# Patient Record
Sex: Female | Born: 2005 | Race: Black or African American | Hispanic: No | Marital: Single | Smoking: Never smoker
Health system: Southern US, Community
[De-identification: ages and names within clinical notes are randomized; demographics above are authoritative.]

## PROBLEM LIST (undated history)

## (undated) DIAGNOSIS — T7840XA Allergy, unspecified, initial encounter: Secondary | ICD-10-CM

## (undated) DIAGNOSIS — F32A Depression, unspecified: Secondary | ICD-10-CM

## (undated) HISTORY — DX: Allergy, unspecified, initial encounter: T78.40XA

## (undated) HISTORY — DX: Depression, unspecified: F32.A

---
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
## 2023-02-11 ENCOUNTER — Telehealth: Admitting: Physician Assistant

## 2023-02-11 DIAGNOSIS — N898 Other specified noninflammatory disorders of vagina: Secondary | ICD-10-CM

## 2023-02-11 DIAGNOSIS — R3989 Other symptoms and signs involving the genitourinary system: Secondary | ICD-10-CM | POA: Diagnosis not present

## 2023-02-11 DIAGNOSIS — R197 Diarrhea, unspecified: Secondary | ICD-10-CM

## 2023-02-11 DIAGNOSIS — M545 Low back pain, unspecified: Secondary | ICD-10-CM

## 2023-02-11 DIAGNOSIS — R300 Dysuria: Secondary | ICD-10-CM

## 2023-02-11 MED ORDER — NITROFURANTOIN MONOHYD MACRO 100 MG PO CAPS: 100.0000 mg | ORAL_CAPSULE | Freq: Two times a day (BID) | ORAL | 0 refills | Status: DC

## 2023-02-11 NOTE — Patient Instructions (Signed)
Taylor Schaefer, thank you for joining Margaretann Loveless, PA-C for today's virtual visit.  While this provider is not your primary care provider (PCP), if your PCP is located in our provider database this encounter information will be shared with them immediately following your visit.   A [facility] MyChart account gives you access to today's visit and all your visits, tests, and labs performed at [facility] " click here if you don't have a [facility] MyChart account or go to [email or web address]  Consent: (Patient) Taylor Schaefer provided verbal consent for this virtual visit at the beginning of the encounter.  Current Medications:  Current Outpatient Medications:    nitrofurantoin, macrocrystal-monohydrate, (MACROBID) 100 MG capsule, Take 1 capsule (100 mg total) by mouth 2 (two) times daily., Disp: 10 capsule, Rfl: 0   Medications ordered in this encounter:  Meds ordered this encounter  Medications   nitrofurantoin, macrocrystal-monohydrate, (MACROBID) 100 MG capsule    Sig: Take 1 capsule (100 mg total) by mouth 2 (two) times daily.    Dispense:  10 capsule    Refill:  0    Order Specific Question:   Supervising Provider    Answer:   Taylor Schaefer [id number]     *If you need refills on other medications prior to your next appointment, please contact your pharmacy*  Follow-Up: Call back or seek an in-person evaluation if the symptoms worsen or if the condition fails to improve as anticipated.  [facility] [phone]  Other Instructions  Urinary Tract Infection, Adult  A urinary tract infection (UTI) is an infection of any part of the urinary tract. The urinary tract includes the kidneys, ureters, bladder, and urethra. These organs make, store, and get rid of urine in the body. An upper UTI affects the ureters and kidneys. A lower UTI affects the bladder and urethra. What are the causes? Most urinary tract infections are caused by bacteria  in your genital area around your urethra, where urine leaves your body. These bacteria grow and cause inflammation of your urinary tract. What increases the risk? You are more likely to develop this condition if: You have a urinary catheter that stays in place. You are not able to control when you urinate or have a bowel movement (incontinence). You are female and you: Use a spermicide or diaphragm for birth control. Have low estrogen levels. Are pregnant. You have certain genes that increase your risk. You are sexually active. You take antibiotic medicines. You have a condition that causes your flow of urine to slow down, such as: An enlarged prostate, if you are female. Blockage in your urethra. A kidney stone. A nerve condition that affects your bladder control (neurogenic bladder). Not getting enough to drink, or not urinating often. You have certain medical conditions, such as: Diabetes. A weak disease-fighting system (immunesystem). Sickle cell disease. Gout. Spinal cord injury. What are the signs or symptoms? Symptoms of this condition include: Needing to urinate right away (urgency). Frequent urination. This may include small amounts of urine each time you urinate. Pain or burning with urination. Blood in the urine. Urine that smells bad or unusual. Trouble urinating. Cloudy urine. Vaginal discharge, if you are female. Pain in the abdomen or the lower back. You may also have: Vomiting or a decreased appetite. Confusion. Irritability or tiredness. A fever or chills. Diarrhea. The first symptom in older adults may be confusion. In some cases, they may not have any symptoms until the infection has worsened. How is  this diagnosed? This condition is diagnosed based on your medical history and a physical exam. You may also have other tests, including: Urine tests. Blood tests. Tests for STIs (sexually transmitted infections). If you have had more than one UTI, a  cystoscopy or imaging studies may be done to determine the cause of the infections. How is this treated? Treatment for this condition includes: Antibiotic medicine. Over-the-counter medicines to treat discomfort. Drinking enough water to stay hydrated. If you have frequent infections or have other conditions such as a kidney stone, you may need to see a health care provider who specializes in the urinary tract (urologist). In rare cases, urinary tract infections can cause sepsis. Sepsis is a life-threatening condition that occurs when the body responds to an infection. Sepsis is treated in the hospital with IV antibiotics, fluids, and other medicines. Follow these instructions at home:  Medicines Take over-the-counter and prescription medicines only as told by your health care provider. If you were prescribed an antibiotic medicine, take it as told by your health care provider. Do not stop using the antibiotic even if you start to feel better. General instructions Make sure you: Empty your bladder often and completely. Do not hold urine for long periods of time. Empty your bladder after sex. Wipe from front to back after urinating or having a bowel movement if you are female. Use each tissue only one time when you wipe. Drink enough fluid to keep your urine pale yellow. Keep all follow-up visits. This is important. Contact a health care provider if: Your symptoms do not get better after 1-2 days. Your symptoms go away and then return. Get help right away if: You have severe pain in your back or your lower abdomen. You have a fever or chills. You have nausea or vomiting. Summary A urinary tract infection (UTI) is an infection of any part of the urinary tract, which includes the kidneys, ureters, bladder, and urethra. Most urinary tract infections are caused by bacteria in your genital area. Treatment for this condition often includes antibiotic medicines. If you were prescribed an  antibiotic medicine, take it as told by your health care provider. Do not stop using the antibiotic even if you start to feel better. Keep all follow-up visits. This is important. This information is not intended to replace advice given to you by your health care provider. Make sure you discuss any questions you have with your health care provider. Document Revised: 03/10/2020 Document Reviewed: 03/15/2020 Elsevier Patient Education  2024 Elsevier Inc.    If you have been instructed to have an in-person evaluation today at a local Urgent Care facility, please use the link below. It will take you to a list of all of our available [facility], including address, phone number and hours of operation. Please do not delay care.  [facility]  If you or a family member do not have a primary care provider, use the link below to schedule a visit and establish care. When you choose a [facility] primary care physician or advanced practice provider, you gain a long-term partner in health. Find a Primary Care Provider  Learn more about [facility] in-office and virtual care options: [facility] - Get Care Now

## 2023-02-11 NOTE — Progress Notes (Signed)
Virtual Visit Consent   Taylor Schaefer, you are scheduled for a virtual visit with a [facility] provider today. Just as with appointments in the office, your consent must be obtained to participate. Your consent will be active for this visit and any virtual visit you may have with one of our providers in the next 365 days. If you have a MyChart account, a copy of this consent can be sent to you electronically.  As this is a virtual visit, video technology does not allow for your provider to perform a traditional examination. This may limit your provider's ability to fully assess your condition. If your provider identifies any concerns that need to be evaluated in person or the need to arrange testing (such as labs, EKG, etc.), we will make arrangements to do so. Although advances in technology are sophisticated, we cannot ensure that it will always work on either your end or our end. If the connection with a video visit is poor, the visit may have to be switched to a telephone visit. With either a video or telephone visit, we are not always able to ensure that we have a secure connection.  By engaging in this virtual visit, you consent to the provision of healthcare and authorize for your insurance to be billed (if applicable) for the services provided during this visit. Depending on your insurance coverage, you may receive a charge related to this service.  I need to obtain your verbal consent now. Are you willing to proceed with your visit today? Taylor Schaefer has provided verbal consent on 02/11/2023 for a virtual visit (video or telephone). Margaretann Loveless, PA-C  Date: 02/11/2023 11:52 AM  Virtual Visit via Video Note   I, Margaretann Loveless, connected with  Taylor Schaefer  ([id number], 2006/02/19) on 02/11/23 at 11:45 AM EDT by a video-enabled telemedicine application and verified that I am speaking with the correct person using two identifiers.  Location: Patient: Virtual Visit Location Patient:  Home Provider: Virtual Visit Location Provider: Home Office   I discussed the limitations of evaluation and management by telemedicine and the availability of in person appointments. The patient expressed understanding and agreed to proceed.    History of Present Illness: Taylor Schaefer is a 17 y.o. who identifies as a female who was assigned female at birth, and is being seen today for suspected UTI.  HPI: Urinary Tract Infection  This is a new problem. The current episode started 1 to 4 weeks ago (about 5-7 days ago). The problem occurs every urination. The problem has been gradually worsening. The quality of the pain is described as burning. The pain is mild. There has been no fever. Associated symptoms include a discharge (clear), frequency, hesitancy and urgency. Pertinent negatives include no chills, flank pain, hematuria, nausea or vomiting. Associated symptoms comments: Cloudy urine, strong ammonia smell to urine, body aches but feels this is from standing in one spot and working long hours at work. She has tried increased fluids for the symptoms. The treatment provided no relief.     Problems: There are no problems to display for this patient.   Allergies:  Allergies  Allergen Reactions   Peanut Oil Rash   Medications:  Current Outpatient Medications:    nitrofurantoin, macrocrystal-monohydrate, (MACROBID) 100 MG capsule, Take 1 capsule (100 mg total) by mouth 2 (two) times daily., Disp: 10 capsule, Rfl: 0  Observations/Objective: Patient is well-developed, well-nourished in no acute distress.  Resting comfortably at home.  Head is normocephalic, atraumatic.  No labored breathing.  Speech is clear and coherent with logical content.  Patient is alert and oriented at baseline.    Assessment and Plan: 1. Suspected UTI - nitrofurantoin, macrocrystal-monohydrate, (MACROBID) 100 MG capsule; Take 1 capsule (100 mg total) by mouth 2 (two) times daily.  Dispense: 10 capsule; Refill:  0  - Worsening symptoms.  - Will treat empirically with Macrobid - May use AZO for bladder spasms - Continue to push fluids.  - Seek in person evaluation for urine culture if symptoms do not improve or if they worsen.    Follow Up Instructions: I discussed the assessment and treatment plan with the patient. The patient was provided an opportunity to ask questions and all were answered. The patient agreed with the plan and demonstrated an understanding of the instructions.  A copy of instructions were sent to the patient via MyChart unless otherwise noted below.    The patient was advised to call back or seek an in-person evaluation if the symptoms worsen or if the condition fails to improve as anticipated.  Time:  I spent 8 minutes with the patient via telehealth technology discussing the above problems/concerns.    Margaretann Loveless, PA-C

## 2023-02-11 NOTE — Progress Notes (Signed)
Because you are having urinary symptoms with vaginal discharge, back pain, and diarrhea, I feel your condition warrants further evaluation and I recommend that you be seen in a face to face visit.   NOTE: There will be NO CHARGE for this eVisit   If you are having a true medical emergency please call 911.      For an urgent face to face visit, [facility] has eight urgent care centers for your convenience:   NEW!! [facility] at [facility] Get Driving Directions [phone] [address], [address] [place], [address]    [facility] Center at [facility] Get Driving Directions [phone] [address] [place], [place] [address]   [facility]gent Care Center [facility]) Get Driving Directions [phone] [address] [place], [place] [address]  [facility] Health Urgent Care Center [facility]) Get Driving Directions [phone] [address] [place],  [place]  [address]  [facility] Urgent Care Center [facility] - at [employer]  [phone] [address] [place],  [place] [address]   [facility]gent Care at [facility] Get Driving Directions [phone] [address] [place], [place] [address]   [facility] Health Urgent Care at [facility] Get Driving Directions  [phone] [address] [place], [place] [address]   [facility] Health Urgent Care at [facility] Directions [phone] [address] [place], [place] [address]  Your MyChart E-visit questionnaire answers were reviewed by a board certified advanced clinical practitioner to complete your personal care plan based on your specific symptoms.  Thank you for using e-Visits.    I have spent 5 minutes in review of e-visit questionnaire, review and updating patient chart, medical decision making and response to patient.    Margaretann Loveless, PA-C

## 2023-04-03 NOTE — Progress Notes (Deleted)
[facility] [address] [place], [place] [address] [phone]  Date:  04/08/2023   Name:  Taziya Robin   DOB:  2005-10-13   MRN:  [id number]  PCP:  Pearline Cables, MD    Chief Complaint: No chief complaint on file.   History of Present Illness:  Isra Mathew is a 17 y.o. very pleasant female patient who presents with the following:  Patient seen today for physical exam Last seen by myself about 1 year ago, also for physical At her last visit she had graduated from high school and was starting college at [employer].  She planned to study criminal justice Enjoys sports including basketball and volleyball  She had a Nexplanon placed last year  It looks like she was seen and treated for chlamydia this past February  Can offer STI screening Recommend flu, COVID booster this fall There are no problems to display for this patient.   Past Medical History:  Diagnosis Date   Allergies    Depression     No past surgical history on file.  Social History   Tobacco Use   Smoking status: Never    Passive exposure: Never   Smokeless tobacco: Never  Vaping Use   Vaping status: Never Used  Substance Use Topics   Alcohol use: Never   Drug use: Not Currently    Types: Marijuana    Family History  Problem Relation Age of Onset   Asthma Mother    Hypertension Maternal Grandmother    Hyperlipidemia Maternal Grandmother    Osteoarthritis Maternal Grandmother    Alcohol abuse Maternal Grandmother    Heart attack Maternal Grandmother    Heart disease Maternal Grandmother    Alcohol abuse Paternal Grandfather     Allergies  Allergen Reactions   Peanut Oil Rash    Medication list has been reviewed and updated.  Current Outpatient Medications on File Prior to Visit  Medication Sig Dispense Refill   nitrofurantoin, macrocrystal-monohydrate, (MACROBID) 100 MG capsule Take 1 capsule (100 mg total) by mouth 2  (two) times daily. 10 capsule 0   No current facility-administered medications on file prior to visit.    Review of Systems:  As per HPI- otherwise negative.   Physical Examination: There were no vitals filed for this visit. There were no vitals filed for this visit. There is no height or weight on file to calculate BMI. Ideal Body Weight:    GEN: no acute distress. HEENT: Atraumatic, Normocephalic.  Ears and Nose: No external deformity. CV: RRR, No M/G/R. No JVD. No thrill. No extra heart sounds. PULM: CTA B, no wheezes, crackles, rhonchi. No retractions. No resp. distress. No accessory muscle use. ABD: S, NT, ND, +BS. No rebound. No HSM. EXTR: No c/c/e PSYCH: Normally interactive. Conversant.    Assessment and Plan: ***  Signed Abbe Amsterdam, MD

## 2023-04-08 ENCOUNTER — Encounter: Admitting: Family Medicine

## 2023-04-20 NOTE — Progress Notes (Deleted)
[facility] [address] [place], [place] [address] [phone]  Date:  04/22/2023   Name:  Taylor Schaefer   DOB:  [birth year]   MRN:  [id number]  PCP:  Pearline Cables, MD    Chief Complaint: No chief complaint on file.   History of Present Illness:  Taylor Schaefer is a 17 y.o. very pleasant female patient who presents with the following: 'May- Lani" Patient seen today for physical exam.  Generally healthy young woman Most recent visit with myself was about 1 year ago; at that time she had recently graduated from high school and was planning to start college at [employer], majoring in criminal justice  She enjoys volleyball and other sports  Since her last visit she was treated for chlamydia-we can do a test of cure for her  Flu shot COVID booster Can update routine blood work   Her HPV series is complete  There are no problems to display for this patient.   Past Medical History:  Diagnosis Date   Allergies    Depression     No past surgical history on file.  Social History   Tobacco Use   Smoking status: Never    Passive exposure: Never   Smokeless tobacco: Never  Vaping Use   Vaping status: Never Used  Substance Use Topics   Alcohol use: Never   Drug use: Not Currently    Types: Marijuana    Family History  Problem Relation Age of Onset   Asthma Mother    Hypertension Maternal Grandmother    Hyperlipidemia Maternal Grandmother    Osteoarthritis Maternal Grandmother    Alcohol abuse Maternal Grandmother    Heart attack Maternal Grandmother    Heart disease Maternal Grandmother    Alcohol abuse Paternal Grandfather     Allergies  Allergen Reactions   Peanut Oil Rash    Medication list has been reviewed and updated.  Current Outpatient Medications on File Prior to Visit  Medication Sig Dispense Refill   nitrofurantoin, macrocrystal-monohydrate, (MACROBID) 100 MG capsule  Take 1 capsule (100 mg total) by mouth 2 (two) times daily. 10 capsule 0   No current facility-administered medications on file prior to visit.    Review of Systems:  As per HPI- otherwise negative.   Physical Examination: There were no vitals filed for this visit. There were no vitals filed for this visit. There is no height or weight on file to calculate BMI. Ideal Body Weight:    GEN: no acute distress. HEENT: Atraumatic, Normocephalic.  Ears and Nose: No external deformity. CV: RRR, No M/G/R. No JVD. No thrill. No extra heart sounds. PULM: CTA B, no wheezes, crackles, rhonchi. No retractions. No resp. distress. No accessory muscle use. ABD: S, NT, ND, +BS. No rebound. No HSM. EXTR: No c/c/e PSYCH: Normally interactive. Conversant.    Assessment and Plan: *** Physical exam today.  Encouraged healthy diet and exercise routine Signed Abbe Amsterdam, MD

## 2023-04-22 ENCOUNTER — Encounter: Admitting: Family Medicine

## 2023-10-29 ENCOUNTER — Encounter: Admitting: Family Medicine

## 2024-01-10 NOTE — Progress Notes (Unsigned)
 [facility] at [facility] [address] [place], [place] [address] [phone]  Date:  01/13/2024   Name:  Taylor Schaefer   DOB:  Jul 23, 2006   MRN:  [id number]  PCP:  Kaylee Partridge, MD    Chief Complaint: No chief complaint on file.   History of Present Illness:  Taylor Schaefer is a 18 y.o. very pleasant female patient who presents with the following:  Pt seen today for a physical exam Last seen by myself about 2 years ago for CPE- at that time she was planning to start college at [employer]  There are no active problems to display for this patient.   Past Medical History:  Diagnosis Date   Allergies    Depression     No past surgical history on file.  Social History   Tobacco Use   Smoking status: Never    Passive exposure: Never   Smokeless tobacco: Never  Vaping Use   Vaping status: Never Used  Substance Use Topics   Alcohol use: Never   Drug use: Not Currently    Types: Marijuana    Family History  Problem Relation Age of Onset   Asthma Mother    Hypertension Maternal Grandmother    Hyperlipidemia Maternal Grandmother    Osteoarthritis Maternal Grandmother    Alcohol abuse Maternal Grandmother    Heart attack Maternal Grandmother    Heart disease Maternal Grandmother    Alcohol abuse Paternal Grandfather     Allergies  Allergen Reactions   Peanut Oil Rash    Medication list has been reviewed and updated.  Current Outpatient Medications on File Prior to Visit  Medication Sig Dispense Refill   nitrofurantoin , macrocrystal-monohydrate, (MACROBID ) 100 MG capsule Take 1 capsule (100 mg total) by mouth 2 (two) times daily. 10 capsule 0   No current facility-administered medications on file prior to visit.    Review of Systems:  As per HPI- otherwise negative.   Physical Examination: There were no vitals filed for this visit. There were no vitals filed for this visit. There is no height or  weight on file to calculate BMI. Ideal Body Weight:    GEN: no acute distress. HEENT: Atraumatic, Normocephalic.  Ears and Nose: No external deformity. CV: RRR, No M/G/R. No JVD. No thrill. No extra heart sounds. PULM: CTA B, no wheezes, crackles, rhonchi. No retractions. No resp. distress. No accessory muscle use. ABD: S, NT, ND, +BS. No rebound. No HSM. EXTR: No c/c/e PSYCH: Normally interactive. Conversant.    Assessment and Plan: *** Physical exam today- encouraged healthy diet and exercise routine  Signed Gates Kasal, MD

## 2024-01-13 ENCOUNTER — Other Ambulatory Visit (HOSPITAL_COMMUNITY)
Admission: RE | Admit: 2024-01-13 | Discharge: 2024-01-13 | Disposition: A | Discharge status: Home / Self Care | Source: Ambulatory Visit | Attending: Family Medicine | Admitting: Family Medicine

## 2024-01-13 ENCOUNTER — Ambulatory Visit (INDEPENDENT_AMBULATORY_CARE_PROVIDER_SITE_OTHER): Admitting: Family Medicine

## 2024-01-13 VITALS — BP 96/60 | HR 85 | Temp 98.7°F | Ht 71.0 in | Wt 162.0 lb

## 2024-01-13 DIAGNOSIS — Z113 Encounter for screening for infections with a predominantly sexual mode of transmission: Secondary | ICD-10-CM

## 2024-01-13 DIAGNOSIS — Z0000 Encounter for general adult medical examination without abnormal findings: Secondary | ICD-10-CM

## 2024-01-13 NOTE — Patient Instructions (Addendum)
 Great to see you again today!  I will be in touch with your results asap Recommend condom use to protect yourself from STI Keep working on a healthy diet and exercise routine.  However your weight is actually just fine for your height!

## 2024-01-14 ENCOUNTER — Encounter: Payer: Self-pay | Admitting: Family Medicine

## 2024-01-14 LAB — CERVICOVAGINAL ANCILLARY ONLY
Bacterial Vaginitis (gardnerella): NEGATIVE
Candida Glabrata: NEGATIVE
Candida Vaginitis: NEGATIVE
Chlamydia: NEGATIVE
Comment: NEGATIVE
Comment: NEGATIVE
Comment: NEGATIVE
Comment: NEGATIVE
Comment: NEGATIVE
Comment: NORMAL
Neisseria Gonorrhea: NEGATIVE
Trichomonas: NEGATIVE

## 2024-03-04 NOTE — Progress Notes (Unsigned)
 [facility] at [facility] [address], [address] [place], [place] [address] [phone]  Date:  03/08/2024   Name:  Taylor Schaefer   DOB:  2006-02-04   MRN:  [id number]  PCP:  Watt Harlene BROCKS, MD    Chief Complaint: No chief complaint on file.   History of Present Illness:  Taylor Schaefer is a 18 y.o. very pleasant female patient who presents with the following:  Patient seen today for concern of  Most recent visit with myself was for physical exam in May She is a Consulting civil engineer at [employer], studying criminal justice  There are no active problems to display for this patient.   Past Medical History:  Diagnosis Date   Allergies    Depression     No past surgical history on file.  Social History   Tobacco Use   Smoking status: Never    Passive exposure: Never   Smokeless tobacco: Never  Vaping Use   Vaping status: Never Used  Substance Use Topics   Alcohol use: Never   Drug use: Not Currently    Types: Marijuana    Family History  Problem Relation Age of Onset   Asthma Mother    Hypertension Maternal Grandmother    Hyperlipidemia Maternal Grandmother    Osteoarthritis Maternal Grandmother    Alcohol abuse Maternal Grandmother    Heart attack Maternal Grandmother    Heart disease Maternal Grandmother    Alcohol abuse Paternal Grandfather     Allergies  Allergen Reactions   Peanut Oil Rash    Medication list has been reviewed and updated.  Current Outpatient Medications on File Prior to Visit  Medication Sig Dispense Refill   etonogestrel  (NEXPLANON ) 68 MG IMPL implant Inject into the skin.     nitrofurantoin , macrocrystal-monohydrate, (MACROBID ) 100 MG capsule Take 1 capsule (100 mg total) by mouth 2 (two) times daily. 10 capsule 0   No current facility-administered medications on file prior to visit.    Review of Systems:  As per HPI- otherwise negative.   Physical Examination: There were no vitals  filed for this visit. There were no vitals filed for this visit. There is no height or weight on file to calculate BMI. Ideal Body Weight:    GEN: no acute distress. HEENT: Atraumatic, Normocephalic.  Ears and Nose: No external deformity. CV: RRR, No M/G/R. No JVD. No thrill. No extra heart sounds. PULM: CTA B, no wheezes, crackles, rhonchi. No retractions. No resp. distress. No accessory muscle use. ABD: S, NT, ND, +BS. No rebound. No HSM. EXTR: No c/c/e PSYCH: Normally interactive. Conversant.    Assessment and Plan: ***  Signed Harlene Watt, MD

## 2024-03-08 ENCOUNTER — Ambulatory Visit: Admitting: Family Medicine

## 2024-03-08 ENCOUNTER — Encounter: Payer: Self-pay | Admitting: Family Medicine

## 2024-03-08 VITALS — BP 112/62 | HR 75 | Ht 71.0 in | Wt 161.6 lb

## 2024-03-08 DIAGNOSIS — Z1329 Encounter for screening for other suspected endocrine disorder: Secondary | ICD-10-CM | POA: Diagnosis not present

## 2024-03-08 DIAGNOSIS — Z113 Encounter for screening for infections with a predominantly sexual mode of transmission: Secondary | ICD-10-CM

## 2024-03-08 DIAGNOSIS — Z131 Encounter for screening for diabetes mellitus: Secondary | ICD-10-CM

## 2024-03-08 DIAGNOSIS — R3989 Other symptoms and signs involving the genitourinary system: Secondary | ICD-10-CM | POA: Diagnosis not present

## 2024-03-08 DIAGNOSIS — Z130 Encounter for screening for diseases of the blood and blood-forming organs and certain disorders involving the immune mechanism: Secondary | ICD-10-CM | POA: Diagnosis not present

## 2024-03-08 DIAGNOSIS — Z13220 Encounter for screening for lipoid disorders: Secondary | ICD-10-CM

## 2024-03-08 LAB — POCT URINALYSIS DIP (MANUAL ENTRY)
Blood, UA: NEGATIVE
Glucose, UA: NEGATIVE mg/dL
Ketones, POC UA: NEGATIVE mg/dL
Leukocytes, UA: NEGATIVE
Nitrite, UA: NEGATIVE
Spec Grav, UA: >=1.03 — AB (ref 1.010–1.025)
Urobilinogen, UA: 0.2 U/dL
pH, UA: 5 (ref 5.0–8.0)

## 2024-03-08 NOTE — Patient Instructions (Signed)
 Good to see you!  I will be in touch with your labs asap Please let me know if any changes or other concerns in the meantime

## 2024-03-09 ENCOUNTER — Encounter: Payer: Self-pay | Admitting: Family Medicine

## 2024-03-09 LAB — CBC
HCT: 34.4 % — ABNORMAL LOW (ref 36.0–46.0)
Hemoglobin: 11.7 g/dL — ABNORMAL LOW (ref 12.0–15.0)
MCHC: 34 g/dL (ref 30.0–36.0)
MCV: 88.1 fl (ref 78.0–100.0)
Platelets: 199 K/uL (ref 150.0–400.0)
RBC: 3.9 Mil/uL (ref 3.87–5.11)
RDW: 13.5 % (ref 11.5–14.6)
WBC: 6 K/uL (ref 4.5–10.5)

## 2024-03-09 LAB — HEPATITIS C ANTIBODY: Hepatitis C Ab: NONREACTIVE

## 2024-03-09 LAB — COMPREHENSIVE METABOLIC PANEL WITH GFR
ALT: 9 U/L (ref 0–35)
AST: 13 U/L (ref 0–37)
Albumin: 4.4 g/dL (ref 3.5–5.2)
Alkaline Phosphatase: 37 U/L — ABNORMAL LOW (ref 39–117)
BUN: 15 mg/dL (ref 6–23)
CO2: 25 meq/L (ref 19–32)
Calcium: 9.1 mg/dL (ref 8.4–10.5)
Chloride: 105 meq/L (ref 96–112)
Creatinine, Ser: 0.72 mg/dL (ref 0.40–1.20)
GFR: 120.68 mL/min (ref >60.00)
Glucose, Bld: 94 mg/dL (ref 70–99)
Potassium: 3.9 meq/L (ref 3.5–5.1)
Sodium: 138 meq/L (ref 135–145)
Total Bilirubin: 0.3 mg/dL (ref 0.2–1.2)
Total Protein: 7 g/dL (ref 6.0–8.3)

## 2024-03-09 LAB — LIPID PANEL
Cholesterol: 145 mg/dL (ref 0–200)
HDL: 52.3 mg/dL (ref >39.00)
LDL Cholesterol: 81 mg/dL (ref 0–99)
NonHDL: 92.26
Total CHOL/HDL Ratio: 3
Triglycerides: 57 mg/dL (ref 0.0–149.0)
VLDL: 11.4 mg/dL (ref 0.0–40.0)

## 2024-03-09 LAB — URINE CULTURE
MICRO NUMBER:: 16735757
Result:: NO GROWTH
SPECIMEN QUALITY:: ADEQUATE

## 2024-03-09 LAB — TSH: TSH: 0.88 u[IU]/mL (ref 0.35–5.50)

## 2024-03-09 LAB — HIV ANTIBODY (ROUTINE TESTING W REFLEX): HIV 1&2 Ab, 4th Generation: NONREACTIVE

## 2024-03-09 LAB — HEMOGLOBIN A1C: Hgb A1c MFr Bld: 5.6 % (ref 4.6–6.5)

## 2024-03-09 LAB — HEPATITIS B SURFACE ANTIBODY, QUANTITATIVE: Hep B S AB Quant (Post): <5 m[IU]/mL — ABNORMAL LOW (ref >=10)

## 2024-03-09 LAB — RPR: RPR Ser Ql: NONREACTIVE

## 2024-03-09 LAB — HEPATITIS B SURFACE ANTIGEN: Hepatitis B Surface Ag: NONREACTIVE

## 2024-03-10 ENCOUNTER — Encounter: Payer: Self-pay | Admitting: Family Medicine

## 2024-03-19 NOTE — Progress Notes (Unsigned)
 Taylor Schaefer at [facility] [address], [address] [place], [place] [address] [phone]  Date:  03/22/2024   Name:  Taylor Schaefer   DOB:  May 30, 2006   MRN:  [id number]  PCP:  Watt Harlene BROCKS, MD    Chief Complaint: No chief complaint on file.   History of Present Illness:  Taylor Schaefer is a 18 y.o. very pleasant female patient who presents with the following:  Patient seen today with concern of STI screening I saw her towards the end of July when she was concerned that her urine looked dark Otherwise she was doing well, working over the summer before heading back to college at [employer] is  We did blood work for her at her last visit, patient would like to have a swab for gonorrhea/chlamydia/trichomoniasis today There are no active problems to display for this patient.   Past Medical History:  Diagnosis Date   Allergies    Depression     No past surgical history on file.  Social History   Tobacco Use   Smoking status: Never    Passive exposure: Never   Smokeless tobacco: Never  Vaping Use   Vaping status: Never Used  Substance Use Topics   Alcohol use: Never   Drug use: Not Currently    Types: Marijuana    Family History  Problem Relation Age of Onset   Asthma Mother    Hypertension Maternal Grandmother    Hyperlipidemia Maternal Grandmother    Osteoarthritis Maternal Grandmother    Alcohol abuse Maternal Grandmother    Heart attack Maternal Grandmother    Heart disease Maternal Grandmother    Alcohol abuse Paternal Grandfather     Allergies  Allergen Reactions   Peanut Oil Rash    Medication list has been reviewed and updated.  Current Outpatient Medications on File Prior to Visit  Medication Sig Dispense Refill   etonogestrel  (NEXPLANON ) 68 MG IMPL implant Inject into the skin.     nitrofurantoin , macrocrystal-monohydrate, (MACROBID ) 100 MG capsule Take 1 capsule (100 mg total) by mouth 2 (two) times daily.  (Patient not taking: Reported on 03/08/2024) 10 capsule 0   No current facility-administered medications on file prior to visit.    Review of Systems:  As per HPI- otherwise negative.   Physical Examination: There were no vitals filed for this visit. There were no vitals filed for this visit. There is no height or weight on file to calculate BMI. Ideal Body Weight:    GEN: no acute distress. HEENT: Atraumatic, Normocephalic.  Ears and Nose: No external deformity. CV: RRR, No M/G/R. No JVD. No thrill. No extra heart sounds. PULM: CTA B, no wheezes, crackles, rhonchi. No retractions. No resp. distress. No accessory muscle use. ABD: S, NT, ND, +BS. No rebound. No HSM. EXTR: No c/c/e PSYCH: Normally interactive. Conversant.    Assessment and Plan: ***  Signed Harlene Watt, MD

## 2024-03-19 NOTE — Patient Instructions (Signed)
 It was great to see you again today, I will be in touch with your labs  Have a wonderful fall semester

## 2024-03-22 ENCOUNTER — Ambulatory Visit: Admitting: Family Medicine

## 2024-03-22 ENCOUNTER — Other Ambulatory Visit (HOSPITAL_COMMUNITY)
Admission: RE | Admit: 2024-03-22 | Discharge: 2024-03-22 | Disposition: A | Discharge status: Home / Self Care | Source: Ambulatory Visit | Attending: Family Medicine | Admitting: Family Medicine

## 2024-03-22 VITALS — BP 110/70 | HR 60 | Ht 71.0 in | Wt 163.0 lb

## 2024-03-22 DIAGNOSIS — Z113 Encounter for screening for infections with a predominantly sexual mode of transmission: Secondary | ICD-10-CM | POA: Insufficient documentation

## 2024-03-24 ENCOUNTER — Encounter: Payer: Self-pay | Admitting: Family Medicine

## 2024-03-24 LAB — CERVICOVAGINAL ANCILLARY ONLY
Bacterial Vaginitis (gardnerella): NEGATIVE
Candida Glabrata: NEGATIVE
Candida Vaginitis: NEGATIVE
Chlamydia: NEGATIVE
Comment: NEGATIVE
Comment: NEGATIVE
Comment: NEGATIVE
Comment: NEGATIVE
Comment: NEGATIVE
Comment: NORMAL
Neisseria Gonorrhea: NEGATIVE
Trichomonas: NEGATIVE
